# Patient Record
Sex: Male | Born: 1965 | Race: Black or African American | Hispanic: No | Marital: Married | Smoking: Never smoker
Health system: Southern US, Community
[De-identification: ages and names within clinical notes are randomized; demographics above are authoritative.]

## PROBLEM LIST (undated history)

## (undated) DIAGNOSIS — I10 Essential (primary) hypertension: Secondary | ICD-10-CM

## (undated) DIAGNOSIS — E119 Type 2 diabetes mellitus without complications: Secondary | ICD-10-CM

---

## 2006-11-23 ENCOUNTER — Ambulatory Visit: Payer: Self-pay | Admitting: Internal Medicine

## 2006-11-23 ENCOUNTER — Emergency Department: Payer: Self-pay | Admitting: Internal Medicine

## 2006-11-23 ENCOUNTER — Other Ambulatory Visit: Payer: Self-pay

## 2007-12-23 IMAGING — CT CT HEAD WITHOUT CONTRAST
2 series · 16 of 30 positions shown, 20 images · non-contrast
Comparison: none

REASON FOR EXAM: pain
COMMENTS:

[Series 2: without · axial · non-contrast · 0.40mm/px · z∈[+380,+500]mm · 13 of 30 slices shown, 17 images]
[im 3/30  brain]
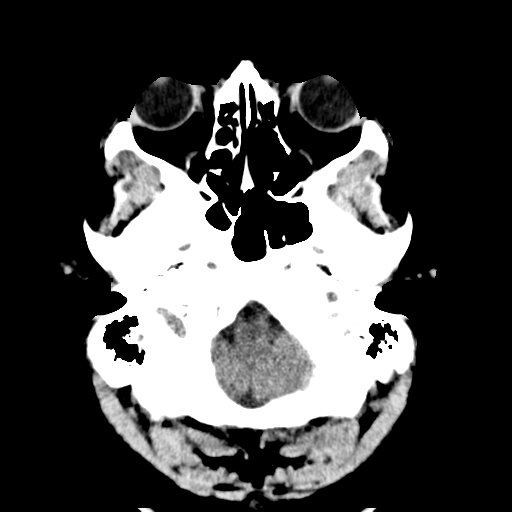
[im 3/30  bone]
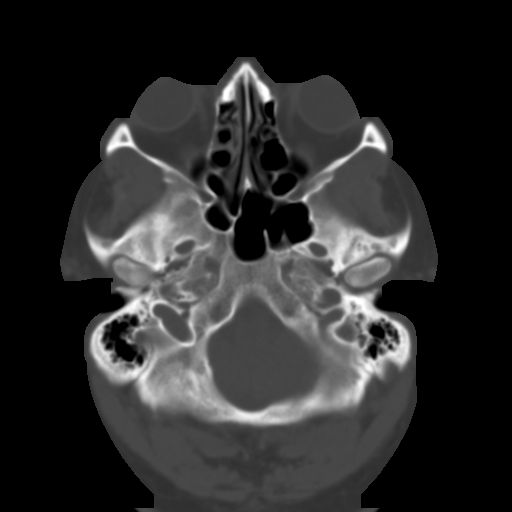
[im 5/30  brain]
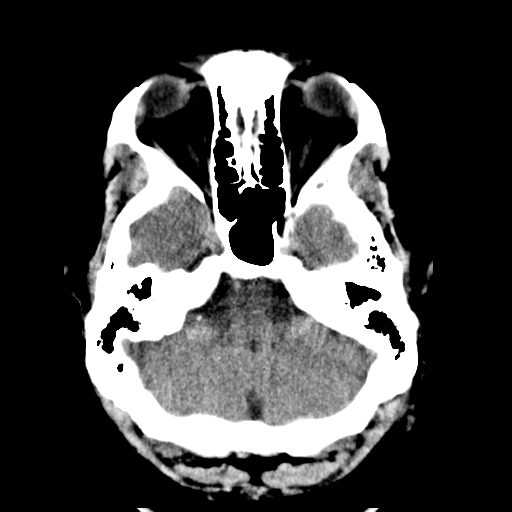
[im 7/30  brain]
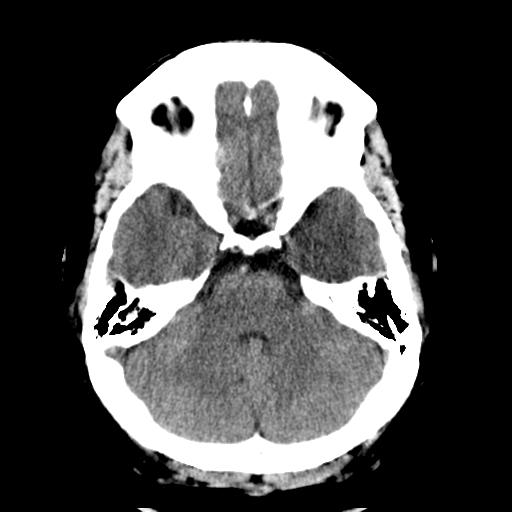
[im 9/30  brain]
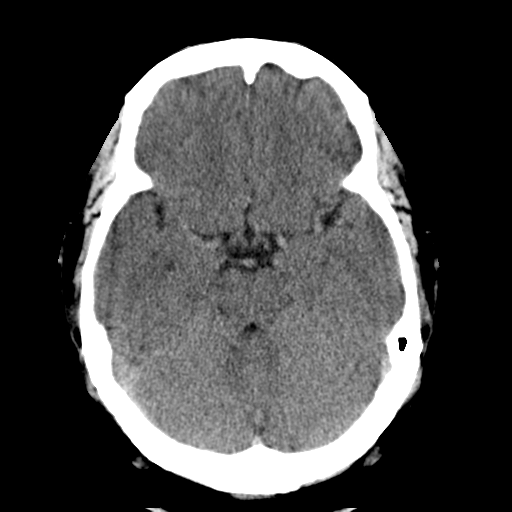
[im 11/30  brain]
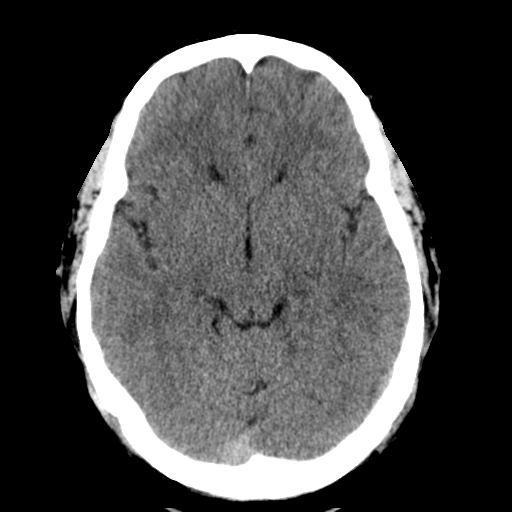
[im 11/30  bone]
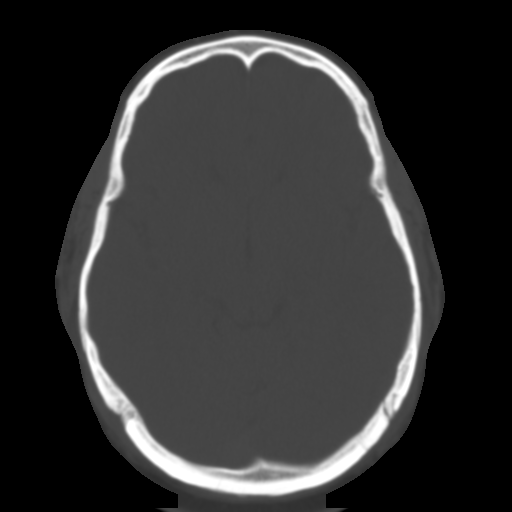
[im 13/30  brain]
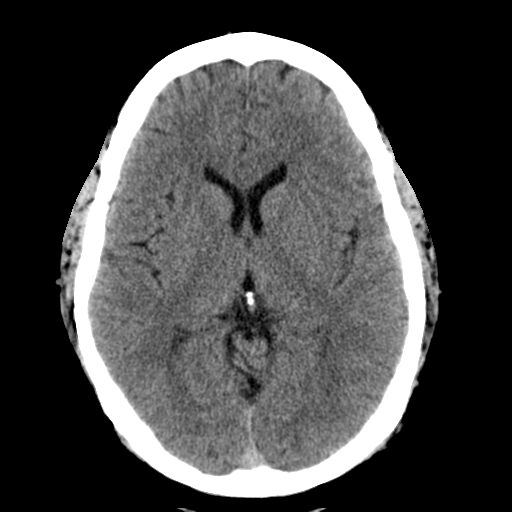
[im 15/30  brain]
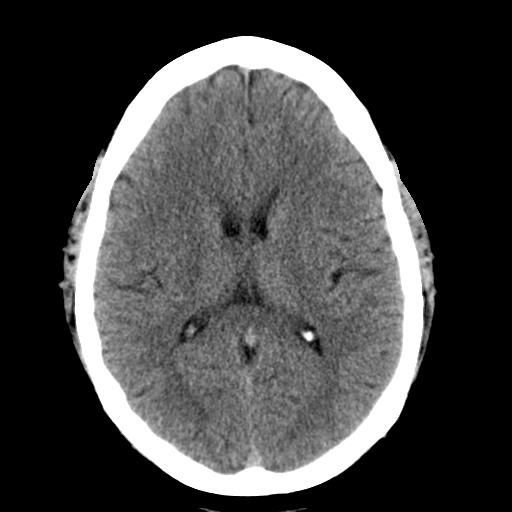
[im 17/30  brain]
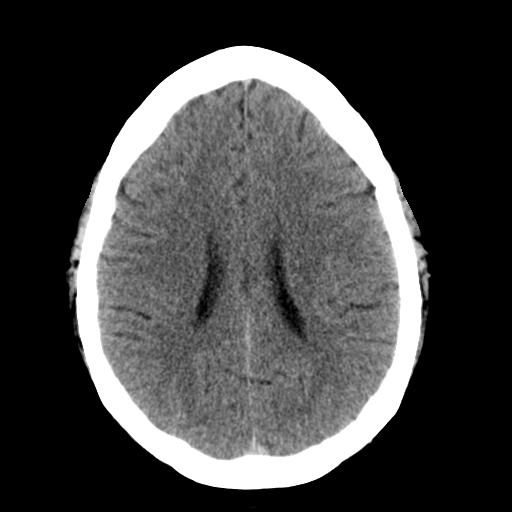
[im 19/30  brain]
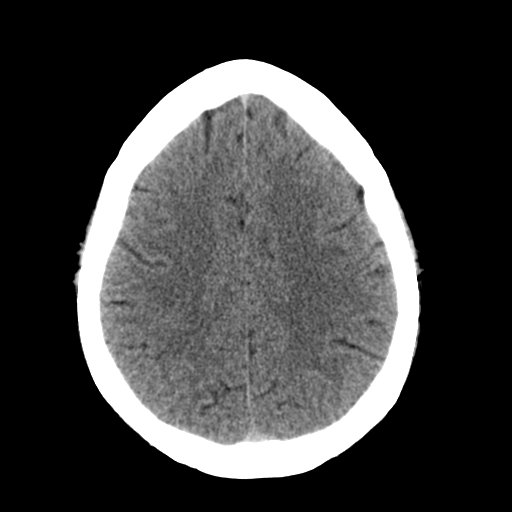
[im 19/30  bone]
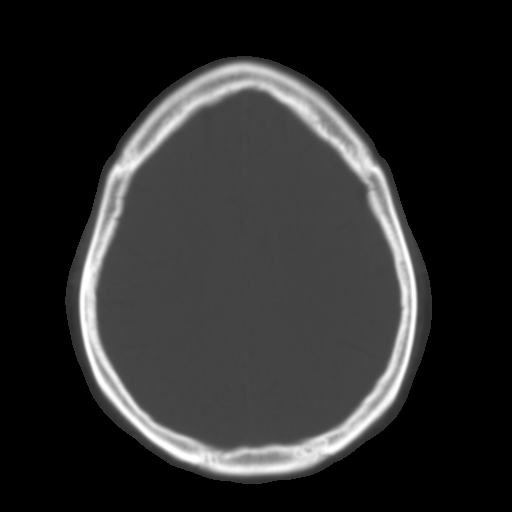
[im 21/30  brain]
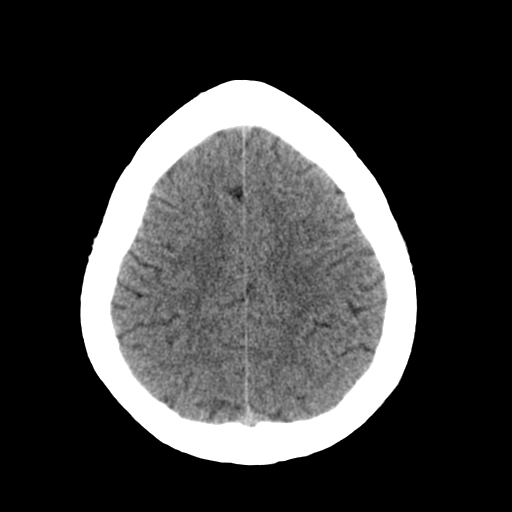
[im 23/30  brain]
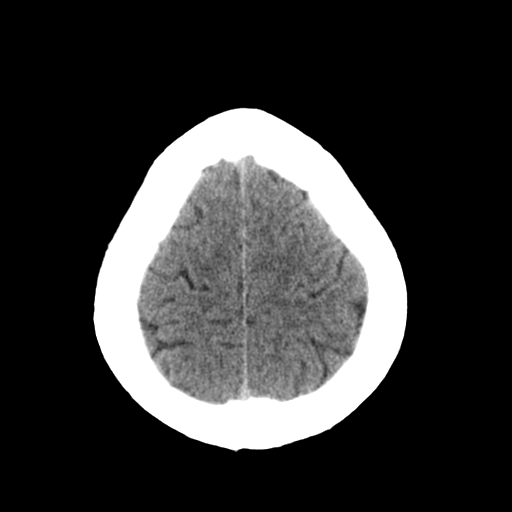
[im 25/30  brain]
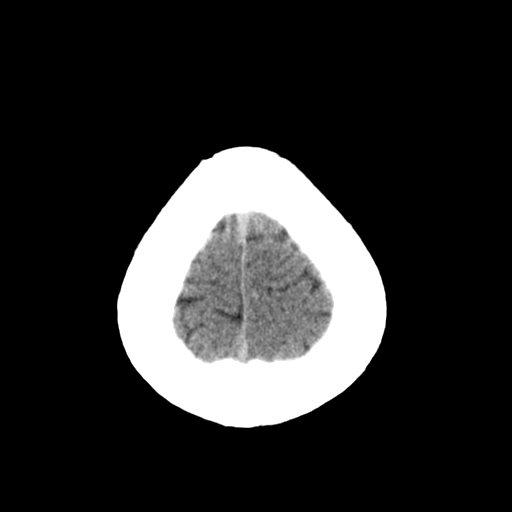
[im 27/30  brain]
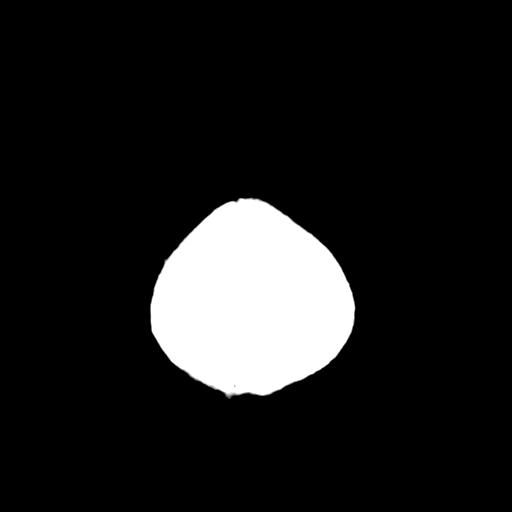
[im 27/30  bone]
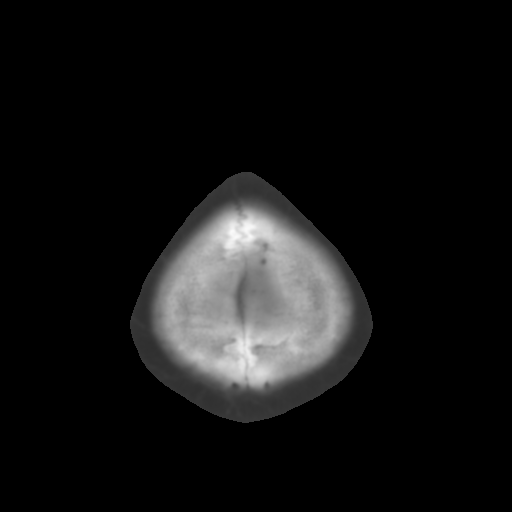

[Series 3: bone · axial · 0.40mm/px · z∈[+380,+420]mm · 3 of 30 slices shown]
[im 3/30  bone]
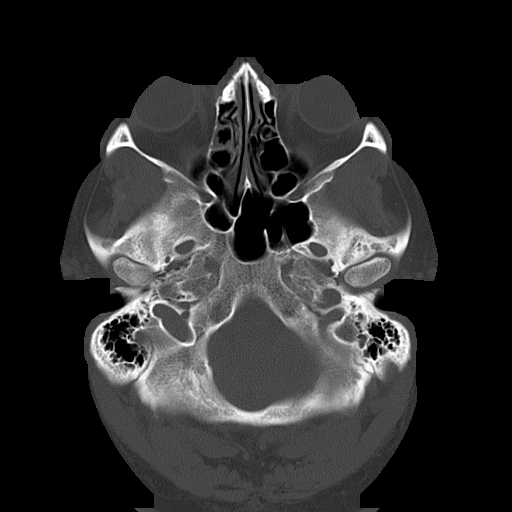
[im 7/30  bone]
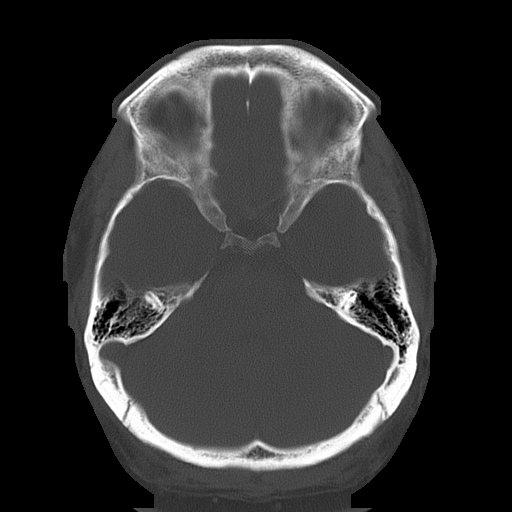
[im 11/30  bone]
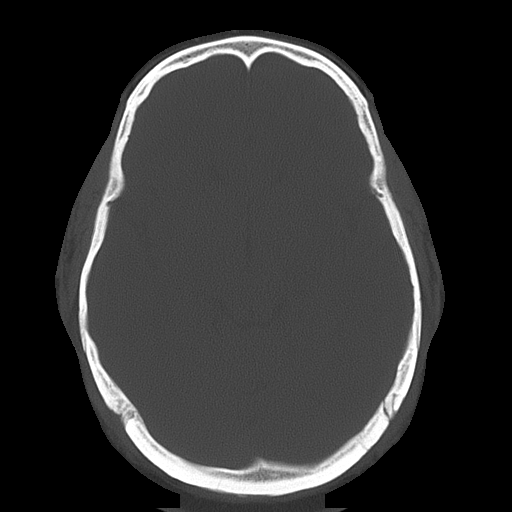

[16 of 30 positions shown; findings below may reference images not displayed]

PROCEDURE:     CT  - CT HEAD WITHOUT CONTRAST  - November 23, 2006  [DATE]

RESULT:     Emergent CT of the brain is performed. The patient has no prior
study for comparison. The ventricles and sulci are normal. There is no
hemorrhage. There is no focal mass, mass-effect or midline shift. There is
no evidence of edema or territorial infarct. The bone windows demonstrate
normal aeration of the paranasal sinuses and mastoid air cells. There is no
skull fracture demonstrated.
IMPRESSION: 1. No acute intracranial abnormality.

## 2008-05-31 ENCOUNTER — Ambulatory Visit: Payer: Self-pay | Admitting: Family Medicine

## 2009-06-30 ENCOUNTER — Ambulatory Visit: Payer: Self-pay | Admitting: Family Medicine

## 2010-04-27 ENCOUNTER — Ambulatory Visit: Payer: Self-pay | Admitting: Family Medicine

## 2010-11-12 ENCOUNTER — Ambulatory Visit: Payer: Self-pay

## 2016-02-13 ENCOUNTER — Encounter: Payer: Self-pay | Admitting: Emergency Medicine

## 2016-02-13 ENCOUNTER — Ambulatory Visit (INDEPENDENT_AMBULATORY_CARE_PROVIDER_SITE_OTHER): Payer: 59

## 2016-02-13 ENCOUNTER — Ambulatory Visit
Admission: EM | Admit: 2016-02-13 | Discharge: 2016-02-13 | Disposition: A | Discharge status: Home / Self Care | Payer: 59 | Attending: Family Medicine | Admitting: Family Medicine

## 2016-02-13 DIAGNOSIS — N200 Calculus of kidney: Secondary | ICD-10-CM

## 2016-02-13 HISTORY — DX: Type 2 diabetes mellitus without complications: E11.9

## 2016-02-13 HISTORY — DX: Essential (primary) hypertension: I10

## 2016-02-13 LAB — URINALYSIS COMPLETE WITH MICROSCOPIC (ARMC ONLY)
BILIRUBIN URINE: NEGATIVE
Bacteria, UA: NONE SEEN
Glucose, UA: NEGATIVE mg/dL
KETONES UR: NEGATIVE mg/dL
Leukocytes, UA: NEGATIVE
NITRITE: NEGATIVE
PROTEIN: NEGATIVE mg/dL
SPECIFIC GRAVITY, URINE: 1.025 (ref 1.005–1.030)
Squamous Epithelial / LPF: NONE SEEN
WBC UA: NONE SEEN WBC/hpf (ref 0–5)
pH: 5 (ref 5.0–8.0)

## 2016-02-13 MED ORDER — KETOROLAC TROMETHAMINE 60 MG/2ML IM SOLN
60.0000 mg | Freq: Once | INTRAMUSCULAR | Status: AC
  Administered 2016-02-13: 60 mg via INTRAMUSCULAR

## 2016-02-13 NOTE — ED Provider Notes (Signed)
[facility]    CSN: [id number] Arrival date & time: 02/13/16  1216  First Provider Contact:  None       History   Chief Complaint Chief Complaint  Patient presents with  . Abdominal Pain    HPI Madie RenoLeon Blasing is a 50 y.o. male.   HPI: Patient presented today with symptoms of right-sided abdominal pain. Patient states that he had some right sided flank pain yesterday but that has resolved. Patient is pacing in the exam room due to his discomfort. He did have an episode of vomiting today. He does still have his appendix. He does describe some sweating earlier today. He denies any chest pain or shortness of breath. He does admit to urinating while he was in the shower earlier today but only a little. He denies any hematuria. He denies any history of kidney stones in the past.  Past Medical History:  Diagnosis Date  . Diabetes mellitus without complication (HCC)   . Hypertension     There are no active problems to display for this patient.   History reviewed. No pertinent surgical history.     Home Medications    Prior to Admission medications   Not on File    Family History History reviewed. No pertinent family history.  Social History Social History  Substance Use Topics  . Smoking status: Never Smoker  . Smokeless tobacco: Never Used  . Alcohol use Yes     Allergies   Review of patient's allergies indicates no known allergies.   Review of Systems Review of Systems: Negative except mentioned above.    Physical Exam Triage Vital Signs ED Triage Vitals  Enc Vitals Group     BP 02/13/16 1225 (!) 139/93     Pulse Rate 02/13/16 1225 76     Resp 02/13/16 1225 17     Temp 02/13/16 1225 97.7 F (36.5 C)     Temp Source 02/13/16 1225 Oral     SpO2 02/13/16 1225 100 %     Weight 02/13/16 1226 242 lb (109.8 kg)     Height 02/13/16 1226 5\' 10"  (1.778 m)     Head Circumference --      Peak Flow --      Pain Score 02/13/16 1227 8     Pain Loc --        Pain Edu? --      Excl. in GC? --    No data found.   Updated Vital Signs BP (!) 139/93 (BP Location: Left Arm)   Pulse 76   Temp 97.7 F (36.5 C) (Oral)   Resp 17   Ht 5\' 10"  (1.778 m)   Wt 242 lb (109.8 kg)   SpO2 100%   BMI 34.72 kg/m      Physical Exam:  GENERAL: moderate discomfort, pacing in room   HEENT: no pharyngeal erythema, no exudate RESP: CTA B CARD: RRR ABD: obese, unable to assess any discomfort of abdomen when lying down, no rebound or guarding, no flank tenderness  NEURO: CN II-XII grossly intact    UC Treatments / Results  Labs (all labs ordered are listed, but only abnormal results are displayed) Labs Reviewed - No data to display  EKG  EKG Interpretation None       Radiology No results found.  Procedures Procedures (including critical care time)  Medications Ordered in UC Medications - No data to display   Initial Impression / Assessment and Plan / UC Course  I  have reviewed the triage vital signs and the nursing notes.  Pertinent labs & imaging results that were available during my care of the patient were reviewed by me and considered in my medical decision making (see chart for details).  Clinical Course   A/P: Renal colic- x-ray results reviewed with patient, patient is in no pain at this time at discharge, discussed with patient that he may have passed the stone, encourage patient to stay hydrated, if symptoms do return I have asked that he go to the ER for further evaluation and treatment. Patient addresses understanding and will follow up with primary care physician this week as well.  Final Clinical Impressions(s) / UC Diagnoses   Final diagnoses:  None    New Prescriptions New Prescriptions   No medications on file     Jolene ProvostKirtida Johnathon Mittal, MD 02/13/16 1424

## 2016-02-13 NOTE — Discharge Instructions (Signed)
Recommend patient go to the emergency department if symptoms do return. Follow up with primary care physician this week as well. Stay hydrated.

## 2016-02-13 NOTE — ED Triage Notes (Signed)
Patient c/o right lower abdominal pain that started this morning.  Patient reports N/V.

## 2017-03-14 IMAGING — CR DG ABDOMEN 1V
2 series · 2 of 2 positions shown · non-contrast
Comparison: None.

CLINICAL DATA: Right lower quadrant region pain

EXAM:
ABDOMEN - 1 VIEW

[abdomen kub (1 of 2)]
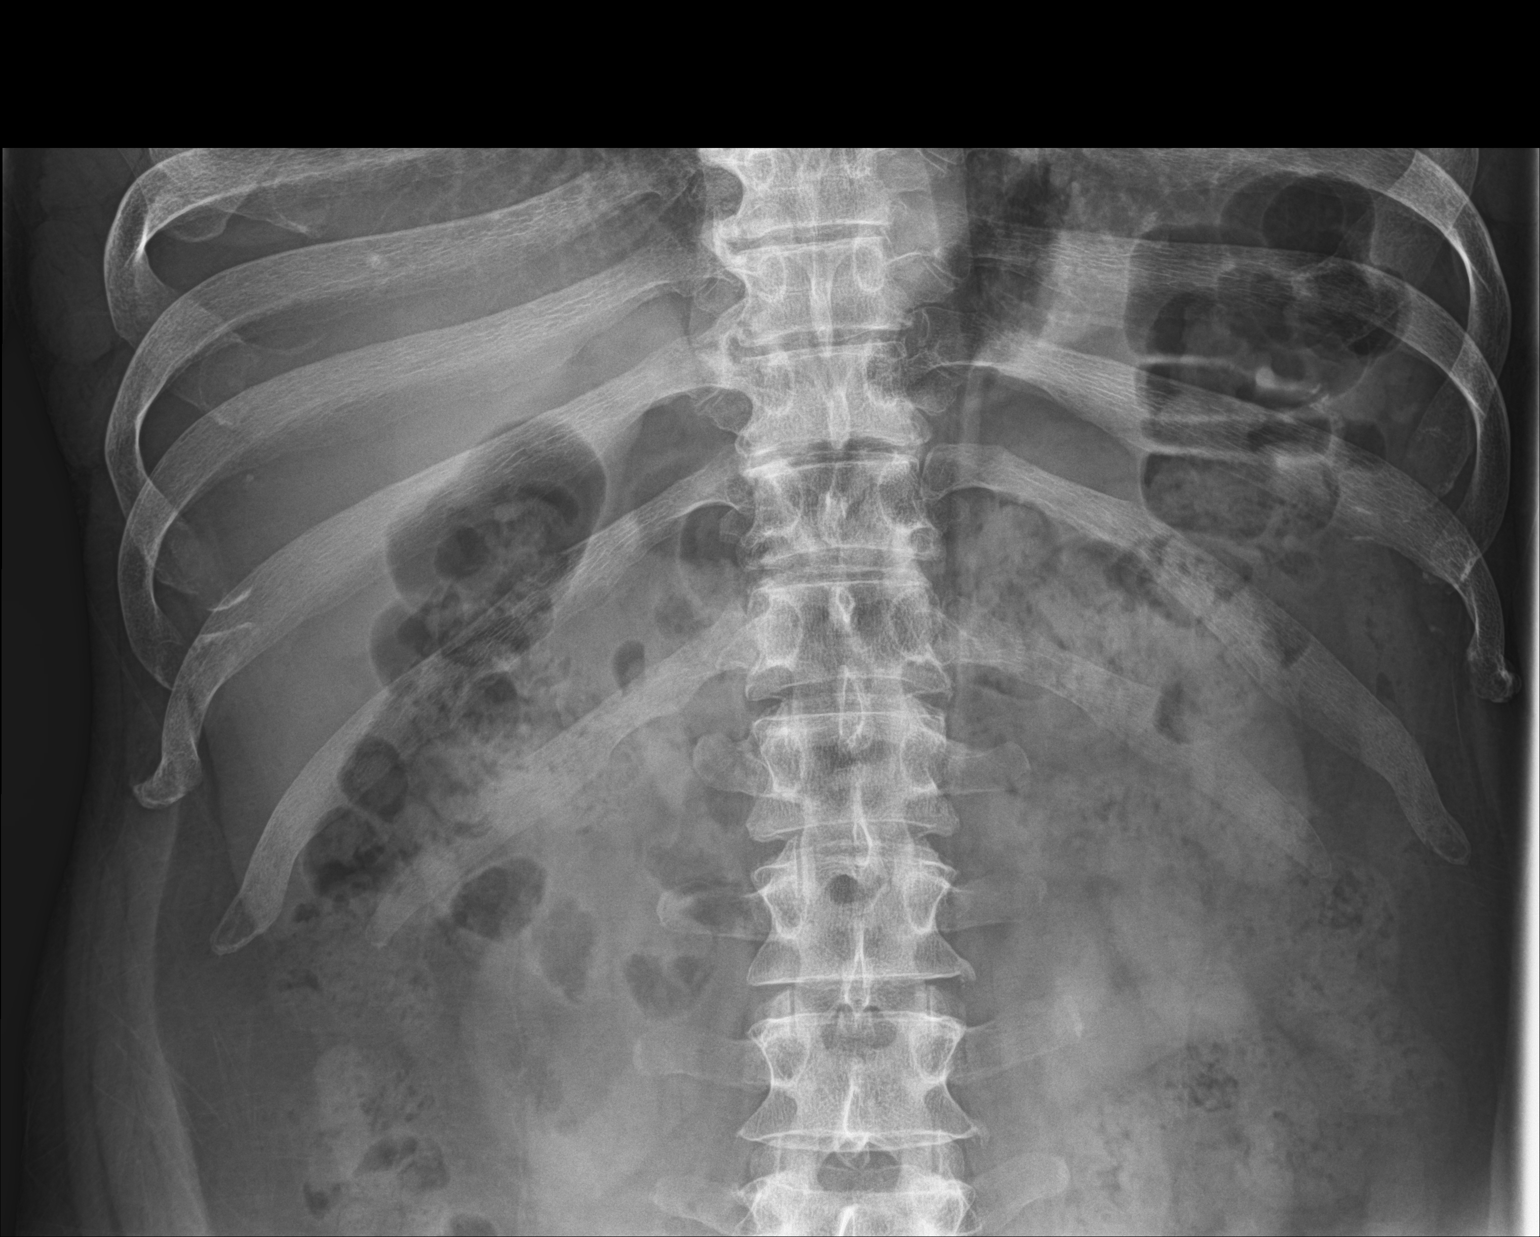

[abdomen kub (2 of 2)]
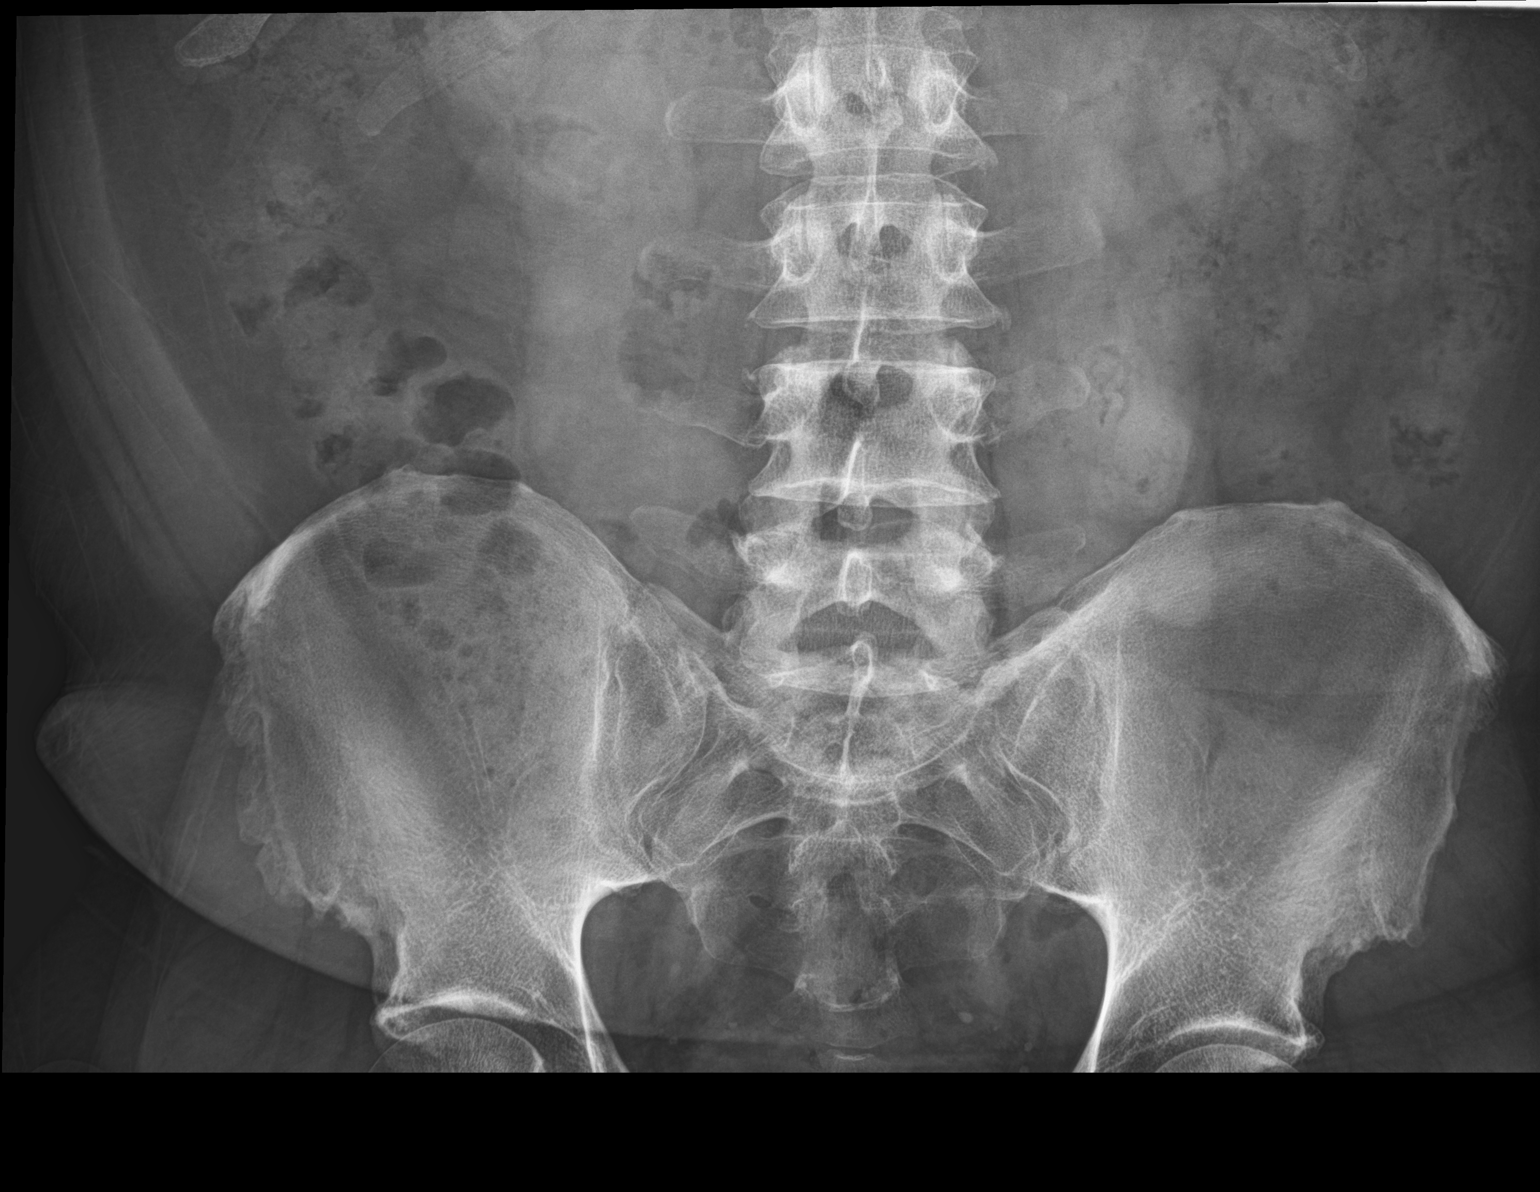

[2 of 2 positions shown; findings below may reference images not displayed]

FINDINGS: There is diffuse stool throughout the colon. There is no bowel
dilatation or air-fluid level to suggest obstruction. No free air.
Small calcifications in the pelvis most likely represent
phleboliths. No other abnormal calcifications are evident.
IMPRESSION: Probable phleboliths in the pelvis. Diffuse stool throughout colon.
No bowel obstruction or free air evident.
